# Patient Record
Sex: Male | Born: 1945 | Race: White | Hispanic: No | Marital: Married | Smoking: Former smoker
Health system: Southern US, Community
[De-identification: ages and names within clinical notes are randomized; demographics above are authoritative.]

## PROBLEM LIST (undated history)

## (undated) DIAGNOSIS — I499 Cardiac arrhythmia, unspecified: Secondary | ICD-10-CM

## (undated) DIAGNOSIS — R251 Tremor, unspecified: Secondary | ICD-10-CM

## (undated) DIAGNOSIS — Z87442 Personal history of urinary calculi: Secondary | ICD-10-CM

## (undated) DIAGNOSIS — T4145XA Adverse effect of unspecified anesthetic, initial encounter: Secondary | ICD-10-CM

## (undated) DIAGNOSIS — R609 Edema, unspecified: Secondary | ICD-10-CM

## (undated) DIAGNOSIS — F32A Depression, unspecified: Secondary | ICD-10-CM

## (undated) DIAGNOSIS — C801 Malignant (primary) neoplasm, unspecified: Secondary | ICD-10-CM

## (undated) DIAGNOSIS — R062 Wheezing: Secondary | ICD-10-CM

## (undated) DIAGNOSIS — T8859XA Other complications of anesthesia, initial encounter: Secondary | ICD-10-CM

## (undated) DIAGNOSIS — F329 Major depressive disorder, single episode, unspecified: Secondary | ICD-10-CM

## (undated) DIAGNOSIS — G939 Disorder of brain, unspecified: Secondary | ICD-10-CM

## (undated) DIAGNOSIS — R011 Cardiac murmur, unspecified: Secondary | ICD-10-CM

## (undated) DIAGNOSIS — G629 Polyneuropathy, unspecified: Secondary | ICD-10-CM

## (undated) DIAGNOSIS — G4730 Sleep apnea, unspecified: Secondary | ICD-10-CM

## (undated) DIAGNOSIS — R413 Other amnesia: Secondary | ICD-10-CM

## (undated) DIAGNOSIS — R0600 Dyspnea, unspecified: Secondary | ICD-10-CM

## (undated) DIAGNOSIS — I10 Essential (primary) hypertension: Secondary | ICD-10-CM

## (undated) DIAGNOSIS — Z950 Presence of cardiac pacemaker: Secondary | ICD-10-CM

## (undated) HISTORY — PX: KIDNEY STONE SURGERY: SHX686

## (undated) HISTORY — PX: INSERT / REPLACE / REMOVE PACEMAKER: SUR710

---

## 2004-07-09 ENCOUNTER — Ambulatory Visit: Payer: Self-pay | Admitting: Unknown Physician Specialty

## 2005-09-12 DIAGNOSIS — Z950 Presence of cardiac pacemaker: Secondary | ICD-10-CM

## 2005-09-12 HISTORY — DX: Presence of cardiac pacemaker: Z95.0

## 2005-09-12 HISTORY — PX: CARDIAC VALVE REPLACEMENT: SHX585

## 2006-07-12 ENCOUNTER — Ambulatory Visit: Payer: Self-pay | Admitting: Ophthalmology

## 2006-07-12 ENCOUNTER — Other Ambulatory Visit: Payer: Self-pay

## 2008-08-15 ENCOUNTER — Inpatient Hospital Stay: Payer: Self-pay | Admitting: Specialist

## 2008-09-11 ENCOUNTER — Emergency Department: Payer: Self-pay | Admitting: Emergency Medicine

## 2012-10-09 ENCOUNTER — Observation Stay: Payer: Self-pay | Admitting: Internal Medicine

## 2012-10-09 DIAGNOSIS — I517 Cardiomegaly: Secondary | ICD-10-CM

## 2012-10-09 LAB — COMPREHENSIVE METABOLIC PANEL
Albumin: 3.5 g/dL (ref 3.4–5.0)
Alkaline Phosphatase: 62 U/L (ref 50–136)
BUN: 12 mg/dL (ref 7–18)
Bilirubin,Total: 1 mg/dL (ref 0.2–1.0)
Calcium, Total: 8.4 mg/dL — ABNORMAL LOW (ref 8.5–10.1)
Co2: 25 mmol/L (ref 21–32)
Creatinine: 0.91 mg/dL (ref 0.60–1.30)
EGFR (African American): >60
EGFR (Non-African Amer.): >60
Osmolality: 279 (ref 275–301)
Potassium: 3.9 mmol/L (ref 3.5–5.1)
SGOT(AST): 35 U/L (ref 15–37)

## 2012-10-09 LAB — CK TOTAL AND CKMB (NOT AT ARMC)
CK, Total: 258 U/L — ABNORMAL HIGH (ref 35–232)
CK, Total: 254 U/L — ABNORMAL HIGH (ref 35–232)
CK-MB: 4.3 ng/mL — ABNORMAL HIGH (ref 0.5–3.6)
CK-MB: 3.6 ng/mL (ref 0.5–3.6)

## 2012-10-09 LAB — CBC
HCT: 40.7 % (ref 40.0–52.0)
MCH: 26.5 pg (ref 26.0–34.0)
MCHC: 32.8 g/dL (ref 32.0–36.0)
MCV: 81 fL (ref 80–100)
Platelet: 224 10*3/uL (ref 150–440)
RDW: 15.8 % — ABNORMAL HIGH (ref 11.5–14.5)

## 2012-10-09 LAB — MAGNESIUM: Magnesium: 2.1 mg/dL

## 2012-10-09 LAB — TROPONIN I
Troponin-I: <0.02 ng/mL
Troponin-I: 0.02 ng/mL

## 2012-10-09 LAB — PROTIME-INR: INR: 1.2

## 2012-10-09 LAB — TSH: Thyroid Stimulating Horm: 3.41 u[IU]/mL

## 2012-10-10 DIAGNOSIS — R079 Chest pain, unspecified: Secondary | ICD-10-CM

## 2012-10-10 LAB — HEMOGLOBIN A1C: Hemoglobin A1C: 6.1 % (ref 4.2–6.3)

## 2012-10-10 LAB — COMPREHENSIVE METABOLIC PANEL
Albumin: 3.3 g/dL — ABNORMAL LOW (ref 3.4–5.0)
Alkaline Phosphatase: 58 U/L (ref 50–136)
Chloride: 106 mmol/L (ref 98–107)
Creatinine: 0.83 mg/dL (ref 0.60–1.30)
EGFR (Non-African Amer.): >60
Osmolality: 278 (ref 275–301)
Potassium: 3.8 mmol/L (ref 3.5–5.1)
SGPT (ALT): 28 U/L (ref 12–78)

## 2012-10-10 LAB — CBC WITH DIFFERENTIAL/PLATELET
Basophil #: 0 10*3/uL (ref 0.0–0.1)
Basophil %: 0.3 %
Eosinophil #: 0.1 10*3/uL (ref 0.0–0.7)
Eosinophil %: 1.3 %
HGB: 13.4 g/dL (ref 13.0–18.0)
Lymphocyte %: 21.5 %
MCH: 26.8 pg (ref 26.0–34.0)
Monocyte #: 0.8 x10 3/mm (ref 0.2–1.0)
Neutrophil #: 5.3 10*3/uL (ref 1.4–6.5)
Platelet: 214 10*3/uL (ref 150–440)
RDW: 15.4 % — ABNORMAL HIGH (ref 11.5–14.5)

## 2012-10-10 LAB — LIPID PANEL
Cholesterol: 123 mg/dL (ref 0–200)
HDL Cholesterol: 26 mg/dL — ABNORMAL LOW (ref 40–60)
Ldl Cholesterol, Calc: 68 mg/dL (ref 0–100)
Triglycerides: 145 mg/dL (ref 0–200)
VLDL Cholesterol, Calc: 29 mg/dL (ref 5–40)

## 2012-10-10 LAB — CK TOTAL AND CKMB (NOT AT ARMC)
CK, Total: 328 U/L — ABNORMAL HIGH (ref 35–232)
CK-MB: 4.6 ng/mL — ABNORMAL HIGH (ref 0.5–3.6)

## 2012-11-19 ENCOUNTER — Emergency Department: Payer: Self-pay | Admitting: Emergency Medicine

## 2013-01-04 ENCOUNTER — Ambulatory Visit: Payer: Self-pay | Admitting: Neurology

## 2014-02-21 ENCOUNTER — Ambulatory Visit (INDEPENDENT_AMBULATORY_CARE_PROVIDER_SITE_OTHER): Payer: Medicare Other

## 2014-02-21 ENCOUNTER — Ambulatory Visit (INDEPENDENT_AMBULATORY_CARE_PROVIDER_SITE_OTHER): Payer: Medicare Other | Admitting: Podiatry

## 2014-02-21 ENCOUNTER — Encounter: Payer: Self-pay | Admitting: Podiatry

## 2014-02-21 VITALS — BP 127/74 | HR 65 | Resp 16 | Ht 71.0 in | Wt 240.0 lb

## 2014-02-21 DIAGNOSIS — G589 Mononeuropathy, unspecified: Secondary | ICD-10-CM

## 2014-02-21 DIAGNOSIS — M779 Enthesopathy, unspecified: Secondary | ICD-10-CM

## 2014-02-21 DIAGNOSIS — M2010 Hallux valgus (acquired), unspecified foot: Secondary | ICD-10-CM

## 2014-02-21 NOTE — Progress Notes (Signed)
   Subjective:    Patient ID: Juan Daniels, male    DOB: 13-Jan-1946, 68 y.o.   MRN: [id number]  HPI Comments: Its both of my feet and toes. i rub them together and it feels like sand paper. My feet swell and my feet turn purple. They are numb and the left is worse than the right. i have a callus on my left foot. This has been going on for about 2 yrs. My feet have remained the same. i soak my feet and use a callus pad.  Foot Pain Associated symptoms include coughing and numbness.      Review of Systems  Constitutional: Positive for appetite change and unexpected weight change.       Sweating  HENT: Positive for sneezing.   Eyes: Positive for redness and visual disturbance.  Respiratory: Positive for cough.   Cardiovascular: Positive for leg swelling.  Gastrointestinal: Positive for diarrhea and constipation.  Endocrine: Positive for heat intolerance.  Neurological: Positive for numbness.  All other systems reviewed and are negative.      Objective:   Physical Exam        Assessment & Plan:

## 2014-02-21 NOTE — Progress Notes (Signed)
Subjective:     Patient ID: Juan Daniels, male   DOB: 1945/10/23, 68 y.o.   MRN: [id number]  Foot Pain   patient presents stating he gets pain in both of his feet left over right and it feels like sandpaper and making burn and tingle at different times. Also has bunions which can become tender  Review of Systems  All other systems reviewed and are negative.      Objective:   Physical Exam  Nursing note and vitals reviewed. Constitutional: He is oriented to person, place, and time.  Cardiovascular: Intact distal pulses.   Musculoskeletal: Normal range of motion.  Neurological: He is oriented to person, place, and time.  Skin: Skin is warm.   I noted neurological sensation to be diminished left over right with reduced both sharp dull and vibratory. Patient is also noted to have digital deformities with lifting of the lesser digits and hyperostosis around the first metatarsal head left over right. Digital flow to the toes was found to be good and there is varicosities in the ankle region of both feet     Assessment:     Patient appears to have some kind of neuropathic changes with no diabetes present at this time. Also has structural imbalance    Plan:     I reviewed conditions and x-rays with patient. He is on gabapentin and will increase his daily dose of this by 300 mg and we discussed the possibility for neuro Genex treatment. Patient will be informed when it is available in the next several months

## 2014-04-01 ENCOUNTER — Ambulatory Visit: Payer: Medicare Other | Admitting: Podiatry

## 2015-01-02 NOTE — H&P (Signed)
PATIENT NAME:  Juan Daniels, Juan Daniels MR#:  [id number] DATE OF BIRTH:  Nov 10, 1945  DATE OF ADMISSION:  10/09/2012  PRIMARY CARE PHYSICIAN: [facility]  HISTORY OF PRESENT ILLNESS: The patient is a 69 year old Caucasian male with past medical history significant for history of December 2009 admission for atrial fibrillation and recently he has been on Pradaxa, hypertension, hyperlipidemia and history of prediabetes who presents to the hospital with complaints of chest pain. According to the patient, he has been having chest pain for the past 2 or 3 days now. Pain is midsternal and intermittent, however, it is uncomfortable, very achy and increasing whenever he walks around with no radiation. There is no significant shortness of breath or any other symptoms. He has been also noticing lower extremity swelling over the past 6 to 8 months now as well as some paresthesias which he describes as numbness sensation as well as sandpaper sensation in his lower extremities. Because of this discomfort, he decided to come to the Emergency Room. He has been also having significant lower extremity cramping. In the Emergency Room, he was noted to be in afib; however, his rate is controlled, in 80s to 90s. He was also hypertensive and he was emergently given metoprolol as well as lisinopril and hospitalist services were contacted for admission. His cardiac enzymes, first set, is unremarkable.   PAST MEDICAL HISTORY:  1. History of admission in December 2009. At that time, he was diagnosed with new onset atrial fibrillation. He is on Pradaxa now for atrial fibrillation. He was also noted to have cardiomyopathy with ejection fraction of 35% to 45%.  2. History of hypertension. 3. History of hyperlipidemia. 4. History of aortic valve disease status post aortic valve replacement in 2004. 5. History of permanent pacemaker placement for unknown reason, in 2010. 6. History of EGD done in October 2005 as well as colonoscopy  done in 2005. 7. History of scleral buckle of left eye in October 2007. 8. Porcine valve in 2004. 9. Prediabetes. 10. Nephrolithiasis. 11. Esophageal nodule. 12. Colon polyps. 13. 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 14. Hiatal hernia. 15. Aortic valvular disease, as mentioned above.   ALLERGIES: No known drug allergies.   MEDICATIONS:  1. Aspirin 81 mg p.o. daily.  2. Cyanocobalamin 1000 mcg p.o. daily.  3. Digoxin 250 mcg p.o. daily.  4. Lisinopril 5 mg p.o. daily. 5. Loratadine 10 mg p.o. daily.  6. Metoprolol succinate 200 mg p.o. daily.  7. Pradaxa 150 mg p.o. twice daily. 8. Sertraline 100 mg 2 tablets once at bedtime.  FAMILY HISTORY: The patient's mother is deceased. She had alcoholism as well as liver disease. The patient's father is deceased from myocardial infarction at age of 3.   SOCIAL HISTORY: The patient has no tobacco, alcohol or illicit drug abuse. He lives in [place], [place]. He is retired. He lives with his wife and he has three children.  REVIEW OF SYSTEMS: Positive for chest pains which started approximately 2 to 3 days ago. He feels tired. He has some blurry vision for which he uses reading glasses. He has a runny nose intermittently. Also obstructive sleep apnea syndrome, especially in the past, as well as stopping to breathe at nighttime while sleeping, also sleepiness in daytime as well as headaches intermittently. Admits to having some cough intermittently with whitish phlegm production and sick for the past couple of days. Admits of having some chest pains, in mid sternal area of the chest. Admits of having some sandpaper feeling  in his feet and numbness of his feet from his knees down as well as some tingling sensation, very uncomfortable feeling. He denies any significant weakness. Admits of having significant cramping in his lower extremities and he needs to jump out from bed.  CONSTITUTIONAL: On arrival the hospital, otherwise, he  denies any fevers, chills, fatigue, weakness, weight loss or gain.  EYES: Denies blurry vision, double vision, glaucoma or cataracts.  ENT: Denies any tinnitus, allergies, epistaxis, sinus pain, dentures or trouble swallowing.  PULMONARY: Denies any wheezing, asthma or COPD. CARDIOVASCULAR: Denies any orthopnea, edema, arrhythmias, palpitations or syncope. GASTROINTESTINAL: Denies any nausea, vomiting, diarrhea or constipation. GENITOURINARY: Denies any dysuria, frequency or incontinence.  ENDOCRINE: Denies any polydipsia,  nocturia, thyroid problems, heat or cold intolerance or thirst. HEMATOLOGIC: Denies anemia, easy bruising, bleeding or swollen glands.  SKIN: Denies any acne, rashes, lesions or change in moles.  MUSCULOSKELETAL: Denies arthritis, cramps or swelling.  NEUROLOGIC: Denies numbness, epilepsy or tremor.  PSYCHIATRIC: Denies anxiety, insomnia or depression.   PHYSICAL EXAMINATION: VITAL SIGNS: On arrival to the hospital, the patient temperature was 98, pulse was 91, respiratory rate was 20, blood pressure 141/92 and saturation was 98% on room air.  GENERAL: Well-nourished, obese Caucasian male in no significant distress lying on the stretcher. He is somewhat uncomfortable, moving his lower extremities periodically. He has also intermittent cramping in his lower extremities.  HEENT: His pupils are equal and reactive to light. Extraocular movements intact. No icterus or conjunctivitis. Normal hearing. No pharyngeal erythema. Mucosa is moist.  NECK: No masses. Supple and nontender. Thyroid not enlarged. No adenopathy. No JVD or carotid bruits bilaterally. Full range of motion.  PULMONARY: Lungs are clear to auscultation, few crackles were heard at bases. Otherwise, no wheezing, no labored inspiration, increased effort, tenderness to percussion or overt respiratory distress.  CARDIOVASCULAR: S1 and S2 appreciated. Irregular, regular. No murmurs, gallops or rubs noted. PMI not  lateralized. Chest is nontender to palpation. 1+ pedal pulses. Trace lower extremity edema. No calf tenderness or cyanosis was noted.  ABDOMEN: Soft and nontender. Bowel sounds are present. No hepatosplenomegaly or masses were noted.  RECTAL: Deferred.  MUSCLE STRENGTH: Able to move all extremities. No cyanosis, degenerative joint disease or kyphosis. Gait was not tested.  SKIN: No rashes, lesions, erythema, nodularity or induration. It was warm and dry to palpation.  LYMPH: No adenopathy in the cervical region.  NEUROLOGICAL: Cranial nerves grossly intact. Sensory is diminished in lower extremities, below his knees, to light touch. No Babinski. No dysarthria or aphasia. The patient is alert and oriented to time, person and place. Cooperative. Memory is good. No significant confusion, agitation or depression noted.   LABS/RADIOLOGIC STUDIES: BMP within normal limits, except calcium level was low at 8.4. Liver enzymes were unremarkable. CK total was elevated at 258, MB fraction is 4.5, and troponin is less than 0.02. Digoxin level is 2.39. White blood cell count is normal at 6.6, hemoglobin was 13.4 and platelet count 224. Pro time is 15.2. INR is 1.2. Activated PTT was 37.4.   Chest x-ray, portable single view, on 28th of January 2014, showed left-sided pacemaker, sternotomy wires are present. Otherwise lungs are clear. No changes were noted.   The patient's EKG revealed afib with premature aberrant conducted complexes at rate of 96 beats per minutes, normal axis. ST-T abnormalities in inferior leads as well as anterolateral leads. No EKG to compare with.   ASSESSMENT AND PLAN: 1. Unstable angina. Admit the patient to the medical floor.  Continue him on metoprolol and aspirin. Add nitroglycerin as well as Lovenox sub-Q, full dose. We will check cardiac enzymes x 3. We will get cardiologist involved for recommendations. We will get Myoview stress test in the morning.  2. Hypertension. Continue  outpatient medications. Add also nitroglycerin topically.  3. Hyperlipidemia. Start the patient on Lipitor and check lipid panel in the morning.  4. History of cardiomyopathy. Get echocardiogram to see valve function and the patient would benefit from ACE inhibitor, which we are going to continue at the current doses at this time.  5. Paraesthesias as well as cramping in lower extremities of unclear etiology. Questionable diabetic neuropathy versus some other disease. We will get neurology involved and we will check the patient's TSH as well as B12 level.   TIME SPENT: 50 minutes. ____________________________ Theodoro Grist, MD rv:sb D: 10/09/2012 12:04:32 ET T: 10/09/2012 12:28:23 ET JOB#: [id number]  cc: Theodoro Grist, MD, <Dictator> [facility] [place]upp MD ELECTRONICALLY SIGNED 11/01/2012 18:27

## 2015-01-02 NOTE — Consult Note (Signed)
General Aspect 69 year old Caucasian male with PMH  of atrial fibrillation since 2009  on Pradaxa, AVR with bioprosthetic valve, moderate aortic root dilation,  hypertension, hyperlipidemia, prediabetes who presents to the hospital with complaints of chest pain. Cardiology was consulted for sx of chest ain concerning for angina.  According to the patient, he has been having chest pain for the past 2 or 3 days. Pain is midsternal and intermittent, very uncomfortable, achy and increasing whenever he walks around with no radiation. no shortness of breath.  also noticed lower extremity swelling over the past few months now as well as some paresthesias which he describes as numbness sensation.  also having significant lower extremity cramping.  "blood pressure has been up"  In the Emergency Room, noted to be in afib; his rate is in 80s to 90s.  hypertensive and he was given metoprolol and lisinopril .    Present Illness . FAMILY HISTORY: mother is deceased. She had alcoholism as well as liver disease. The patient's father is deceased from myocardial infarction at age of 51.   SOCIAL HISTORY: The patient has no tobacco, alcohol or illicit drug abuse. He lives in [place]. retired, lives with his wife and he has three children.   Physical Exam:   GEN well developed, well nourished, no acute distress    HEENT red conjunctivae    NECK supple  No masses    RESP normal resp effort  clear BS    CARD Irregular rate and rhythm  No murmur    ABD denies tenderness  soft    LYMPH negative neck    EXTR negative edema    SKIN normal to palpation    NEURO motor/sensory function intact    PSYCH alert, A+O to time, place, person, good insight   Review of Systems:   Subjective/Chief Complaint legs feel funny, swollen, sandpaper-like    General: No Complaints    Skin: No Complaints    ENT: No Complaints    Eyes: No Complaints    Neck: No Complaints    Respiratory: No Complaints     Cardiovascular: Chest pain or discomfort    Gastrointestinal: No Complaints    Genitourinary: No Complaints    Vascular: No Complaints    Musculoskeletal: No Complaints    Neurologic: leg numbness up to knees    Hematologic: No Complaints    Endocrine: No Complaints    Psychiatric: No Complaints    Review of Systems: All other systems were reviewed and found to be negative    Medications/Allergies Reviewed Medications/Allergies reviewed     Borderline Diabetes:    CAD:    HTN:    CABG:        Admit Reason:   Chest pain: (786.50) Active, ICD9, Unspecified chest pain  Home Medications: Medication Instructions Status  cyanocobalamin 1000 mcg oral tablet 1 tab(s) orally once a day Active  lisinopril tablet 10 mg 0.5 tab(s) orally once a day Active  aspirin 81 mg oral tablet 1 tab(s) orally once a day Active  metoprolol succinate 200 mg oral tablet, extended release 1 tab(s) orally once a day Active  Pradaxa 150 mg oral capsule 1 cap(s) orally 2 times a day Active  loratadine 10 mg oral tablet 1 tab(s) orally once a day Active  digoxin 250 mcg (0.25 mg) oral tablet 1 tab(s) orally once a day Active  sertraline 100 mg oral tablet 2 tab(s) orally once a day (at bedtime) Active  simvastatin 80 mg oral tablet  0.5 tab(s) orally once a day (at bedtime) Active   Lab Results:  Hepatic:  29-Jan-14 01:10    Bilirubin, Total  1.2   Alkaline Phosphatase 58   SGPT (ALT) 28   SGOT (AST) 30   Total Protein, Serum 6.6   Albumin, Serum  3.3  Routine Chem:  29-Jan-14 01:10    Cholesterol, Serum 123   Triglycerides, Serum 145   HDL (INHOUSE)  26   VLDL Cholesterol Calculated 29   LDL Cholesterol Calculated 68 (Result(s) reported on 10 Oct 2012 at 01:50AM.)   Glucose, Serum 97   BUN 10   Creatinine (comp) 0.83   Sodium, Serum 140   Potassium, Serum 3.8   Chloride, Serum 106   CO2, Serum 24   Calcium (Total), Serum 8.6   Osmolality (calc) 278   eGFR (African American)  >60   eGFR (Non-African American) >60 (eGFR values <78m/min/1.73 m2 may be an indication of chronic kidney disease (CKD). Calculated eGFR is useful in patients with stable renal function. The eGFR calculation will not be reliable in acutely ill patients when serum creatinine is changing rapidly. It is not useful in  patients on dialysis. The eGFR calculation may not be applicable to patients at the low and high extremes of body sizes, pregnant women, and vegetarians.)   Anion Gap 10  Cardiac:  29-Jan-14 01:10    CK, Total  328   CPK-MB, Serum  4.6 (Result(s) reported on 10 Oct 2012 at 01:40AM.)   Troponin I < 0.02 (0.00-0.05 0.05 ng/mL or less: NEGATIVE  Repeat testing in 3-6 hrs  if clinically indicated. >0.05 ng/mL: POTENTIAL  MYOCARDIAL INJURY. Repeat  testing in 3-6 hrs if  clinically indicated. NOTE: An increase or decrease  of 30% or more on serial  testing suggests a  clinically important change)  Routine Hem:  29-Jan-14 01:10    WBC (CBC) 8.0   RBC (CBC) 5.02   Hemoglobin (CBC) 13.4   Hematocrit (CBC) 40.5   Platelet Count (CBC) 214   MCV 81   MCH 26.8   MCHC 33.2   RDW  15.4   Neutrophil % 66.6   Lymphocyte % 21.5   Monocyte % 10.3   Eosinophil % 1.3   Basophil % 0.3   Neutrophil # 5.3   Lymphocyte # 1.7   Monocyte # 0.8   Eosinophil # 0.1   Basophil # 0.0 (Result(s) reported on 10 Oct 2012 at 01:53AM.)   EKG:   Interpretation EKG shows NSR with diffuse St adn T wave ABN in V4 to V6, I and AVL, II, III, AVF    NKDA: None  Vital Signs/Nurse's Notes: **Vital Signs.:   29-Jan-14 08:00   Vital Signs Type Routine   Temperature Temperature (F) 98.1   Celsius 36.7   Temperature Source oral   Pulse Pulse 78   Respirations Respirations 18   Systolic BP Systolic BP 1301  Diastolic BP (mmHg) Diastolic BP (mmHg) 83   Mean BP 96   Pulse Ox % Pulse Ox % 95   Pulse Ox Activity Level  At rest   Oxygen Delivery Room Air/ 21 %     Impression 69year old  Caucasian male with PMH  of atrial fibrillation since 2009  on Pradaxa, AVR with bioprosthetic valve, moderate aortic root dilation,  hypertension, hyperlipidemia, prediabetes who presents to the hospital with complaints of chest pain, leg swelling, numbness, "high blood pressure".  1)Chest pain Some typical and atypical features. Significant EKG changes, diffuse  ST and T wave changes (previously noted in cardiology consult in 12/09) ECHO with normal EF. Stress test pending If no ischemia, could d/c from cardiac perspective Will need outpt followup He is on asa, statin, b-blocker  2) Atrial fibrillation Chronic since 2009 Rate well controlled. On pradaxa (though he does not like this medication) He could discuss alternatives with MD at the [facility] (xarelto?)  3) AVR (for regurg?) bioprothetic valve working well. well seated, no significant regurg  4) Dilated aortic root  moderate size He will need close followup in our office or the [place] Will need outpt chest CT with contrast for aortic root dilitation, to evaluate the ascending aorta/arch. 4.7 cm root, normal <3 cm  5) Neuropathy of LE This seems to bother him the most, getting worse, now up to knees. Take B12 po at home Could check b12 level. Other etiology?   Electronic Signatures: Ida Rogue (MD)  (Signed 29-Jan-14 10:54)  Authored: General Aspect/Present Illness, History and Physical Exam, Review of System, Past Medical History, Health Issues, Home Medications, Labs, EKG , Allergies, Vital Signs/Nurse's Notes, Impression/Plan   Last Updated: 29-Jan-14 10:54 by Ida Rogue (MD)

## 2015-01-02 NOTE — Discharge Summary (Signed)
PATIENT NAME:  Juan Daniels, Juan Daniels MR#:  [id number] DATE OF BIRTH:  16-Jan-1946  DATE OF ADMISSION:  10/09/2012 DATE OF DISCHARGE:  10/10/2012  ADMITTING DIAGNOSIS: Unstable angina.   DISCHARGE DIAGNOSES: 1. Chest pain of unclear etiology at this time, likely noncardiac. Normal Myoview. Echocardiogram showed normal ejection fraction of more than 55% with moderate concentric left ventricular hypertrophy, aortic root dilatation. Right ventricular systolic pressures are normal. 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 2. History of hypertension. 3. Hyperlipidemia. 4. Paresthesias. 5. 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 Outpatient neurology followup is pending.  6. History of atrial fibrillation. 7. Prediabetes with hemoglobin A1c 6.1 on current admission.  DISCHARGE CONDITION: Stable.   DISCHARGE MEDICATIONS: The patient is to resume his outpatient medications which are:  1. Lisinopril 5 mg p.o. daily.  2. Digoxin 250 mcg p.o. daily. 3. Pradaxa 150 mg p.o. twice daily. 4. Sertraline 200 mg p.o. at bedtime.  5. Loratadine 10 mg p.o. at bedtime.  6. Simvastatin 40 mg p.o. at bedtime.  7. Aspirin 81 mg p.o. daily.  8. Metoprolol succinate 200 mg p.o. day. 9. vitamin B12 1000 mcg p.o. daily.  HOME OXYGEN: None.   DIET: 2 grams salt, low fat, low cholesterol, carbohydrate-controlled diet, regular consistency.   ACTIVITY LIMITATIONS: As tolerated.   DISCHARGE FOLLOWUP: With the [facility] in 2 days after discharge.   Recommend CT scanning of the patient's chest as soon as possible to compare with prior evaluations, in regards to aortic root dilatation.  CONSULTANTS: Dr. Ida Rogue and care management.   RADIOLOGIC STUDIES: Chest x-ray, portable single view, on 28th of January 20014, revealed left-sided pacemaker present, sternotomy wires are present, otherwise everything  unremarkable.  Nuclear medicine myocardial scan, on 29th of January 20014, showed pharmacological myocardial perfusion imaging study no significant ischemia was noted. No wall motion abnormality noted. Ejection fraction is 70%. No EKG changes concerning for ischemia. Overall this is a low risk scan. Chest pain is likely noncardiac, according to cardiologist, Dr. Rockey Situ.  Echocardiogram, on 28th of January 20014, challenging image study: Moderate aortic root dilatation. Left ventricular systolic function is normal, ejection fraction equal or more than 55%. There is moderate concentric left ventricular hypertrophy. The left atrium is slightly dilated. Right ventricular systolic pressure is normal. Bioprosthetic valve: Mild gradient consistent with bioprosthetic valve was noted.  HISTORY OF PRESENT ILLNESS: This is a  69 year old Caucasian male with history of coronary artery disease who presented to the hospital on the 28th of January 20014 with complaints of chest pains. Please refer to Dr. Keenan Bachelor admission note on the 28th of January 20014. On arrival to the hospital, the patient was complaining of midsternal, intermittent pain, achy, and increasing whenever he walked around. His vital signs on arrival to the hospital showed temperature of 98, pulse was 91, respiratory rate was 20, blood pressure 141/92 and saturation was 98% on room air. Of note, the patient was missing all his medications in the past 3 days because of some issues receiving medications. The patient's EKG showed afib with premature aberrantly conduced complexes at rate of 96 beats per minute, normal axis, ST-T wave abnormalities in inferior leads as well as anterolateral leads, but no EKG to compare with. ST-T depressions were noted in anterolateral as well as inferior leads, but no EKG to compare with. Chest x-ray was unremarkable.  HOSPITAL COURSE: The patient was admitted to the hospital. His first labs in the Emergency  Room where  unremarkable with normal BMP, normal magnesium level of 2.1, normal liver enzyme testing and normal cardiac enzymes, except for mild elevation of CK total to 258 and MB fraction of 4.3. TSH was normal at 3.41. Digoxin level was 2.39. CBC was within normal limits with hemoglobin level of 13.4, white blood cell count 6.6 and platelet count of 224. Coagulation panel showed a pro time of 15.2, INR was 1.2 and activated PTT was 37.4. D-dimer was normal at 0.34. Vitamin B12 in serum level was 950.  The patient was admitted to the hospital. His cardiac enzymes were cycled and consultation with Dr. Rockey Situ was obtained. Echocardiogram as well as Myoview stress test were performed and those were unremarkable. The patient's echocardiogram revealed LVH as well as aortic root dilatation, however,  Dr. Rockey Situ felt that the patient would benefit from follow up with the [place] or Dr. Donivan Scull office, as outpatient, to get a CT scan of the chest given the moderate size dilation of the aortic root on echocardiogram. The patient needs to be back to have his distended aorta evaluated. However, he felt that this could have been related to his valve. Dr. Rockey Situ also felt that the patient's chest pain etiology was likely noncardiac. The patient was reinitiated on his usual medications and his chest pain disappeared. His chest pain cause remains unclear, however, it did not seem to be cardiac. It was felt the patient is safe to be discharged to home to follow up with his primary care physician at the [facility] and that was communicated to the patient on discharge.  In regards to hypertension and hyperlipidemia, the patient is to continue his outpatient medications as mentioned. Blood pressure was well controlled. On the day of discharge, 29th of January 2014, the patient's temperature is 97.4, pulse was 59, respiratory rate was 18, blood pressure 107/66 and saturation 94% to 95% on room air at rest.   For hyperlipidemia, the patient's  lipid panel was checked while he was in the hospital and LDL was found to be 68. The patient's total cholesterol was 123, triglycerides 145 and HDL was 26. Hemoglobin A1c was checked and was found to be 6.1. The patient is to continue his lipid management with his current medications.   The patient was complaining of significant paraesthesias in his lower extremities, seems to be worsening over a period of a few months now. It was unclear if the patient has neuropathy of unclear etiology. The patient's B12 level was checked which was good and the patient will be scheduled for followup with a neurologist as outpatient.   In regards to his history of afib, the patient is to continue metoprolol.  For prediabetes, the patient is to continue diabetic diet and if possible lose weight and that was also communicated to him.   The patient is being discharged in stable condition with the above-mentioned medications and followup.   TIME SPENT: 40 minutes.  ____________________________ Theodoro Grist, MD rv:sb D: 10/10/2012 20:06:17 ET T: 10/11/2012 07:16:20 ET JOB#: [id number]  cc: Theodoro Grist, MD, <Dictator> [place] MD ELECTRONICALLY SIGNED 11/01/2012 18:39

## 2016-07-19 ENCOUNTER — Encounter: Payer: Self-pay | Admitting: *Deleted

## 2016-07-21 ENCOUNTER — Ambulatory Visit: Payer: Medicare Other | Admitting: Anesthesiology

## 2016-07-21 ENCOUNTER — Encounter
Admission: RE | Disposition: A | Discharge status: Home / Self Care | Payer: Self-pay | Source: Ambulatory Visit | Attending: Ophthalmology

## 2016-07-21 ENCOUNTER — Ambulatory Visit
Admission: RE | Admit: 2016-07-21 | Discharge: 2016-07-21 | Disposition: A | Discharge status: Home / Self Care | Payer: Medicare Other | Source: Ambulatory Visit | Attending: Ophthalmology | Admitting: Ophthalmology

## 2016-07-21 ENCOUNTER — Encounter: Payer: Self-pay | Admitting: Anesthesiology

## 2016-07-21 DIAGNOSIS — Z952 Presence of prosthetic heart valve: Secondary | ICD-10-CM | POA: Insufficient documentation

## 2016-07-21 DIAGNOSIS — I10 Essential (primary) hypertension: Secondary | ICD-10-CM | POA: Diagnosis not present

## 2016-07-21 DIAGNOSIS — G629 Polyneuropathy, unspecified: Secondary | ICD-10-CM | POA: Diagnosis not present

## 2016-07-21 DIAGNOSIS — Z87891 Personal history of nicotine dependence: Secondary | ICD-10-CM | POA: Insufficient documentation

## 2016-07-21 DIAGNOSIS — I4891 Unspecified atrial fibrillation: Secondary | ICD-10-CM | POA: Diagnosis not present

## 2016-07-21 DIAGNOSIS — Z7982 Long term (current) use of aspirin: Secondary | ICD-10-CM | POA: Insufficient documentation

## 2016-07-21 DIAGNOSIS — Z950 Presence of cardiac pacemaker: Secondary | ICD-10-CM | POA: Diagnosis not present

## 2016-07-21 DIAGNOSIS — G4730 Sleep apnea, unspecified: Secondary | ICD-10-CM | POA: Insufficient documentation

## 2016-07-21 DIAGNOSIS — Z85828 Personal history of other malignant neoplasm of skin: Secondary | ICD-10-CM | POA: Diagnosis not present

## 2016-07-21 DIAGNOSIS — H2512 Age-related nuclear cataract, left eye: Secondary | ICD-10-CM | POA: Diagnosis present

## 2016-07-21 DIAGNOSIS — Z79899 Other long term (current) drug therapy: Secondary | ICD-10-CM | POA: Diagnosis not present

## 2016-07-21 HISTORY — DX: Cardiac arrhythmia, unspecified: I49.9

## 2016-07-21 HISTORY — DX: Other amnesia: R41.3

## 2016-07-21 HISTORY — DX: Edema, unspecified: R60.9

## 2016-07-21 HISTORY — DX: Sleep apnea, unspecified: G47.30

## 2016-07-21 HISTORY — DX: Tremor, unspecified: R25.1

## 2016-07-21 HISTORY — DX: Other complications of anesthesia, initial encounter: T88.59XA

## 2016-07-21 HISTORY — DX: Personal history of urinary calculi: Z87.442

## 2016-07-21 HISTORY — DX: Major depressive disorder, single episode, unspecified: F32.9

## 2016-07-21 HISTORY — DX: Cardiac murmur, unspecified: R01.1

## 2016-07-21 HISTORY — DX: Disorder of brain, unspecified: G93.9

## 2016-07-21 HISTORY — DX: Depression, unspecified: F32.A

## 2016-07-21 HISTORY — DX: Presence of cardiac pacemaker: Z95.0

## 2016-07-21 HISTORY — DX: Adverse effect of unspecified anesthetic, initial encounter: T41.45XA

## 2016-07-21 HISTORY — DX: Essential (primary) hypertension: I10

## 2016-07-21 HISTORY — DX: Dyspnea, unspecified: R06.00

## 2016-07-21 HISTORY — DX: Polyneuropathy, unspecified: G62.9

## 2016-07-21 HISTORY — PX: CATARACT EXTRACTION W/PHACO: SHX586

## 2016-07-21 HISTORY — DX: Wheezing: R06.2

## 2016-07-21 HISTORY — DX: Malignant (primary) neoplasm, unspecified: C80.1

## 2016-07-21 SURGERY — PHACOEMULSIFICATION, CATARACT, WITH IOL INSERTION
Anesthesia: Monitor Anesthesia Care | Site: Eye | Laterality: Left | Wound class: Clean

## 2016-07-21 MED ORDER — MOXIFLOXACIN HCL 0.5 % OP SOLN
1.0000 [drp] | OPHTHALMIC | Status: DC | PRN
  Administered 2016-07-21 (×3): 1 [drp] via OPHTHALMIC

## 2016-07-21 MED ORDER — SODIUM HYALURONATE 23 MG/ML IO SOLN
INTRAOCULAR | Status: AC
  Filled 2016-07-21: qty 0.6

## 2016-07-21 MED ORDER — SODIUM HYALURONATE 10 MG/ML IO SOLN
INTRAOCULAR | Status: DC | PRN
Start: 2016-07-21 — End: 2016-07-21
  Administered 2016-07-21: 0.85 mL via INTRAOCULAR

## 2016-07-21 MED ORDER — LIDOCAINE HCL (PF) 4 % IJ SOLN
INTRAMUSCULAR | Status: DC | PRN
  Administered 2016-07-21: 4 mL via OPHTHALMIC

## 2016-07-21 MED ORDER — EPINEPHRINE PF 1 MG/ML IJ SOLN
INTRAMUSCULAR | Status: DC | PRN
  Administered 2016-07-21: 1 mL via OPHTHALMIC

## 2016-07-21 MED ORDER — MIDAZOLAM HCL 2 MG/2ML IJ SOLN
INTRAMUSCULAR | Status: DC | PRN
  Administered 2016-07-21: 1 mg via INTRAVENOUS

## 2016-07-21 MED ORDER — CYCLOPENTOLATE HCL 2 % OP SOLN
1.0000 [drp] | OPHTHALMIC | Status: DC | PRN
  Administered 2016-07-21 (×3): 1 [drp] via OPHTHALMIC

## 2016-07-21 MED ORDER — LIDOCAINE HCL (PF) 4 % IJ SOLN
INTRAMUSCULAR | Status: AC
  Filled 2016-07-21: qty 5

## 2016-07-21 MED ORDER — MOXIFLOXACIN HCL 0.5 % OP SOLN
OPHTHALMIC | Status: DC | PRN
  Administered 2016-07-21: 1 [drp] via OPHTHALMIC

## 2016-07-21 MED ORDER — SODIUM HYALURONATE 10 MG/ML IO SOLN
INTRAOCULAR | Status: AC
  Filled 2016-07-21: qty 0.85

## 2016-07-21 MED ORDER — ARMC OPHTHALMIC DILATING DROPS: 1.0000 "application " | OPHTHALMIC | Status: AC

## 2016-07-21 MED ORDER — POVIDONE-IODINE 5 % OP SOLN
OPHTHALMIC | Status: AC
  Filled 2016-07-21: qty 30

## 2016-07-21 MED ORDER — EPINEPHRINE PF 1 MG/ML IJ SOLN
INTRAMUSCULAR | Status: AC
  Filled 2016-07-21: qty 2

## 2016-07-21 MED ORDER — CARBACHOL 0.01 % IO SOLN
INTRAOCULAR | Status: DC | PRN
  Administered 2016-07-21: 0.5 mL via INTRAOCULAR

## 2016-07-21 MED ORDER — PHENYLEPHRINE HCL 10 % OP SOLN
1.0000 [drp] | OPHTHALMIC | Status: DC
  Administered 2016-07-21 (×3): 1 [drp] via OPHTHALMIC

## 2016-07-21 MED ORDER — SODIUM CHLORIDE 0.9 % IV SOLN
INTRAVENOUS | Status: DC
  Administered 2016-07-21: 09:00:00 via INTRAVENOUS

## 2016-07-21 MED ORDER — SODIUM HYALURONATE 23 MG/ML IO SOLN
INTRAOCULAR | Status: DC | PRN
Start: 2016-07-21 — End: 2016-07-21
  Administered 2016-07-21: 0.6 mL via INTRAOCULAR

## 2016-07-21 SURGICAL SUPPLY — 23 items
CANNULA ANT/CHMB 27GA (MISCELLANEOUS) ×6 IMPLANT
CUP MEDICINE 2OZ PLAST GRAD ST (MISCELLANEOUS) ×3 IMPLANT
DISSECTOR HYDRO NUCLEUS 50X22 (MISCELLANEOUS) ×6 IMPLANT
GLOVE BIO SURGEON STRL SZ8 (GLOVE) ×3 IMPLANT
GLOVE BIOGEL M 6.5 STRL (GLOVE) ×3 IMPLANT
GLOVE SURG LX 7.5 STRW (GLOVE) ×2
GLOVE SURG LX STRL 7.5 STRW (GLOVE) ×1 IMPLANT
GOWN STRL REUS W/ TWL LRG LVL3 (GOWN DISPOSABLE) ×2 IMPLANT
GOWN STRL REUS W/TWL LRG LVL3 (GOWN DISPOSABLE) ×4
LENS IOL TECNIS ITEC 17.5 (Intraocular Lens) ×3 IMPLANT
NEEDLE CAPSULORHEX 25GA (NEEDLE) ×3 IMPLANT
PACK CATARACT (MISCELLANEOUS) ×3 IMPLANT
PACK CATARACT BRASINGTON LX (MISCELLANEOUS) ×3 IMPLANT
PACK EYE AFTER SURG (MISCELLANEOUS) ×3 IMPLANT
SOL BSS BAG (MISCELLANEOUS) ×3
SOL PREP PVP 2OZ (MISCELLANEOUS) ×3
SOLUTION BSS BAG (MISCELLANEOUS) ×1 IMPLANT
SOLUTION PREP PVP 2OZ (MISCELLANEOUS) ×1 IMPLANT
SYR 3ML LL SCALE MARK (SYRINGE) ×6 IMPLANT
SYR 5ML LL (SYRINGE) ×3 IMPLANT
SYR TB 1ML 27GX1/2 LL (SYRINGE) ×3 IMPLANT
WATER STERILE IRR 250ML POUR (IV SOLUTION) ×3 IMPLANT
WIPE NON LINTING 3.25X3.25 (MISCELLANEOUS) ×3 IMPLANT

## 2016-07-21 NOTE — Anesthesia Preprocedure Evaluation (Addendum)
Anesthesia Evaluation  Patient identified by MRN, date of birth, ID band Patient awake    Reviewed: Allergy & Precautions, NPO status , Patient's Chart, lab work & pertinent test results, reviewed documented beta blocker date and time   Airway Mallampati: II  TM Distance: >3 FB     Dental  (+) Upper Dentures, Lower Dentures   Pulmonary shortness of breath, sleep apnea , former smoker,           Cardiovascular hypertension, Pt. on medications + dysrhythmias Atrial Fibrillation + pacemaker + Valvular Problems/Murmurs      Neuro/Psych    GI/Hepatic   Endo/Other    Renal/GU      Musculoskeletal   Abdominal   Peds  Hematology   Anesthesia Other Findings Pt states aortic valve.  Reproductive/Obstetrics                            Anesthesia Physical Anesthesia Plan  ASA: III  Anesthesia Plan: MAC   Post-op Pain Management:    Induction:   Airway Management Planned:   Additional Equipment:   Intra-op Plan:   Post-operative Plan:   Informed Consent: I have reviewed the patients History and Physical, chart, labs and discussed the procedure including the risks, benefits and alternatives for the proposed anesthesia with the patient or authorized representative who has indicated his/her understanding and acceptance.     Plan Discussed with: CRNA  Anesthesia Plan Comments:         Anesthesia Quick Evaluation

## 2016-07-21 NOTE — Op Note (Signed)
OPERATIVE NOTE  Juan Daniels [id number] 07/21/2016   PREOPERATIVE DIAGNOSIS:  Nuclear sclerotic cataract left eye.  H25.12   POSTOPERATIVE DIAGNOSIS:    Nuclear sclerotic cataract left eye.     PROCEDURE:  Phacoemusification with posterior chamber intraocular lens placement of the left eye   LENS:   Implant Name Type Inv. Item Serial No. Manufacturer Lot No. LRB No. Used  LENS IOL DIOP 17.5 - [id number] Intraocular Lens LENS IOL DIOP 17.5 [id number]   Left 1       PCB00 +17.5   ULTRASOUND TIME: 0 minutes 51 seconds.  CDE 7.70   SURGEON:  Benay Pillow, MD, MPH   ANESTHESIA:  Topical with tetracaine drops augmented with 1% preservative-free intracameral lidocaine.   COMPLICATIONS:  None.   DESCRIPTION OF PROCEDURE:  The patient was identified in the holding room and transported to the operating room and placed in the supine position under the operating microscope.  The left eye was identified as the operative eye and it was prepped and draped in the usual sterile ophthalmic fashion.   A 1.0 millimeter clear-corneal paracentesis was made at the 5:00 position. 0.5 ml of preservative-free 1% lidocaine with epinephrine was injected into the anterior chamber.  The anterior chamber was filled with Healon 5 viscoelastic.  A 2.4 millimeter keratome was used to make a near-clear corneal incision at the 2:00 position.  A curvilinear capsulorrhexis was made with a cystotome and capsulorrhexis forceps.  Balanced salt solution was used to hydrodissect and hydrodelineate the nucleus.   Phacoemulsification was then used in stop and chop fashion to remove the lens nucleus and epinucleus.  The remaining cortex was then removed using the irrigation and aspiration handpiece. Healon was then placed into the capsular bag to distend it for lens placement.  A lens was then injected into the capsular bag.  The remaining viscoelastic was aspirated.   Wounds were hydrated with balanced salt solution.  The  anterior chamber was inflated to a physiologic pressure with balanced salt solution.  Intracameral vigamox 0.1 mL undiltued was injected into the eye.  There was moderate patient movement throughout the case as he drifted in and out of consciousness.  The pupil was moderately dilated.   No wound leaks were noted.  Topical Vigamox drops were applied to the eye.  The patient was taken to the recovery room in stable condition without complications of anesthesia or surgery  Benay Pillow 07/21/2016, 9:49 AM

## 2016-07-21 NOTE — Transfer of Care (Signed)
Immediate Anesthesia Transfer of Care Note  Patient: Juan Daniels  Procedure(s) Performed: Procedure(s) with comments: CATARACT EXTRACTION PHACO AND INTRAOCULAR LENS PLACEMENT (IOC) (Left) - [place] 51.1 AP% 15.1 CDE 7.70 FLUID PACK LOT # [id number]  Patient Location: PACU  Anesthesia Type:MAC  Level of Consciousness: awake, alert  and oriented  Airway & Oxygen Therapy: Patient Spontanous Breathing  Post-op Assessment: Report given to RN  Post vital signs: Reviewed and stable  Last Vitals:  Vitals:   07/21/16 0825 07/21/16 0950  BP: 139/81 127/77  Pulse: 63 61  Resp: 17 12  Temp: 36.8 C 36.8 C    Last Pain:  Vitals:   07/21/16 0950  TempSrc: Oral      Patients Stated Pain Goal: 0 ([id number])  Complications: No apparent anesthesia complications

## 2016-07-21 NOTE — Discharge Instructions (Signed)
Eye Surgery Discharge Instructions  Expect mild scratchy sensation or mild soreness. DO NOT RUB YOUR EYE!  The day of surgery:  Minimal physical activity, but bed rest is not required  No reading, computer work, or close hand work  No bending, lifting, or straining.  May watch TV  For 24 hours:  No driving, legal decisions, or alcoholic beverages  Safety precautions  Eat anything you prefer: It is better to start with liquids, then soup then solid foods.  _____ Eye patch should be worn until postoperative exam tomorrow.  ____ Solar shield eyeglasses should be worn for comfort in the sunlight/patch while sleeping  Resume all regular medications including aspirin or Coumadin if these were discontinued prior to surgery. You may shower, bathe, shave, or wash your hair. Tylenol may be taken for mild discomfort.  Call your doctor if you experience significant pain, nausea, or vomiting, fever > 101 or other signs of infection. [phone] or [phone] Specific instructions:  Follow-up Information    Benay Pillow, MD Follow up.   Specialty:  Ophthalmology Why:  November 10 at 9:15am Contact information: [address] [place] [address] [phone]

## 2016-07-21 NOTE — Anesthesia Postprocedure Evaluation (Signed)
Anesthesia Post Note  Patient: Juan Daniels  Procedure(s) Performed: Procedure(s) (LRB): CATARACT EXTRACTION PHACO AND INTRAOCULAR LENS PLACEMENT (IOC) (Left)  Patient location during evaluation: PACU Anesthesia Type: MAC Level of consciousness: awake, awake and alert and oriented Pain management: pain level controlled Vital Signs Assessment: post-procedure vital signs reviewed and stable Respiratory status: spontaneous breathing, nonlabored ventilation and respiratory function stable Cardiovascular status: blood pressure returned to baseline and stable Postop Assessment: no headache, no backache, patient able to bend at knees, no signs of nausea or vomiting and adequate PO intake Anesthetic complications: no    Last Vitals:  Vitals:   07/21/16 0825 07/21/16 0950  BP: 139/81 127/77  Pulse: 63 61  Resp: 17 12  Temp: 36.8 C 36.8 C    Last Pain:  Vitals:   07/21/16 0950  TempSrc: Oral                 Hedda Slade

## 2016-07-21 NOTE — H&P (Signed)
The History and Physical notes are on paper, have been signed, and are to be scanned. The patient remains stable and unchanged from the H&P.   Previous H&P reviewed, patient examined, and there are no changes.  Juan Daniels 07/21/2016 9:11 AM

## 2016-07-22 ENCOUNTER — Encounter: Payer: Self-pay | Admitting: Ophthalmology

## 2016-08-03 ENCOUNTER — Encounter: Payer: Self-pay | Admitting: *Deleted

## 2016-08-11 ENCOUNTER — Ambulatory Visit: Payer: Medicare Other | Admitting: Anesthesiology

## 2016-08-11 ENCOUNTER — Encounter: Payer: Self-pay | Admitting: *Deleted

## 2016-08-11 ENCOUNTER — Encounter
Admission: RE | Disposition: A | Discharge status: Home / Self Care | Payer: Self-pay | Source: Ambulatory Visit | Attending: Ophthalmology

## 2016-08-11 ENCOUNTER — Ambulatory Visit
Admission: RE | Admit: 2016-08-11 | Discharge: 2016-08-11 | Disposition: A | Discharge status: Home / Self Care | Payer: Medicare Other | Source: Ambulatory Visit | Attending: Ophthalmology | Admitting: Ophthalmology

## 2016-08-11 DIAGNOSIS — Z955 Presence of coronary angioplasty implant and graft: Secondary | ICD-10-CM | POA: Insufficient documentation

## 2016-08-11 DIAGNOSIS — F329 Major depressive disorder, single episode, unspecified: Secondary | ICD-10-CM | POA: Diagnosis not present

## 2016-08-11 DIAGNOSIS — Z85828 Personal history of other malignant neoplasm of skin: Secondary | ICD-10-CM | POA: Diagnosis not present

## 2016-08-11 DIAGNOSIS — I10 Essential (primary) hypertension: Secondary | ICD-10-CM | POA: Diagnosis not present

## 2016-08-11 DIAGNOSIS — G629 Polyneuropathy, unspecified: Secondary | ICD-10-CM | POA: Diagnosis not present

## 2016-08-11 DIAGNOSIS — Z87891 Personal history of nicotine dependence: Secondary | ICD-10-CM | POA: Insufficient documentation

## 2016-08-11 DIAGNOSIS — G4730 Sleep apnea, unspecified: Secondary | ICD-10-CM | POA: Insufficient documentation

## 2016-08-11 DIAGNOSIS — I4891 Unspecified atrial fibrillation: Secondary | ICD-10-CM | POA: Insufficient documentation

## 2016-08-11 DIAGNOSIS — H2511 Age-related nuclear cataract, right eye: Secondary | ICD-10-CM | POA: Insufficient documentation

## 2016-08-11 HISTORY — PX: CATARACT EXTRACTION W/PHACO: SHX586

## 2016-08-11 SURGERY — PHACOEMULSIFICATION, CATARACT, WITH IOL INSERTION
Anesthesia: Monitor Anesthesia Care | Site: Eye | Laterality: Right | Wound class: Clean

## 2016-08-11 MED ORDER — MIDAZOLAM HCL 2 MG/2ML IJ SOLN
INTRAMUSCULAR | Status: DC | PRN
  Administered 2016-08-11: 1 mg via INTRAVENOUS

## 2016-08-11 MED ORDER — SODIUM CHLORIDE 0.9 % IV SOLN
INTRAVENOUS | Status: DC
  Administered 2016-08-11: 08:00:00 via INTRAVENOUS

## 2016-08-11 MED ORDER — POVIDONE-IODINE 5 % OP SOLN
OPHTHALMIC | Status: AC
  Filled 2016-08-11: qty 30

## 2016-08-11 MED ORDER — FENTANYL CITRATE (PF) 100 MCG/2ML IJ SOLN
INTRAMUSCULAR | Status: DC | PRN
  Administered 2016-08-11: 50 ug via INTRAVENOUS

## 2016-08-11 MED ORDER — SODIUM HYALURONATE 23 MG/ML IO SOLN
INTRAOCULAR | Status: AC
  Filled 2016-08-11: qty 0.6

## 2016-08-11 MED ORDER — SODIUM HYALURONATE 10 MG/ML IO SOLN
INTRAOCULAR | Status: AC
  Filled 2016-08-11: qty 0.85

## 2016-08-11 MED ORDER — ARMC OPHTHALMIC DILATING DROPS
1.0000 "application " | OPHTHALMIC | Status: AC
  Administered 2016-08-11 (×3): 1 via OPHTHALMIC

## 2016-08-11 MED ORDER — EPINEPHRINE PF 1 MG/ML IJ SOLN
INTRAMUSCULAR | Status: AC
  Filled 2016-08-11: qty 2

## 2016-08-11 MED ORDER — MOXIFLOXACIN HCL 0.5 % OP SOLN: 1.0000 [drp] | OPHTHALMIC | Status: DC | PRN

## 2016-08-11 MED ORDER — LIDOCAINE HCL (PF) 4 % IJ SOLN
INTRAMUSCULAR | Status: AC
  Filled 2016-08-11: qty 5

## 2016-08-11 MED ORDER — ARMC OPHTHALMIC DILATING DROPS
OPHTHALMIC | Status: AC
  Filled 2016-08-11: qty 0.4

## 2016-08-11 MED ORDER — MOXIFLOXACIN HCL 0.5 % OP SOLN
OPHTHALMIC | Status: AC
  Filled 2016-08-11: qty 3

## 2016-08-11 SURGICAL SUPPLY — 23 items
CANNULA ANT/CHMB 27GA (MISCELLANEOUS) ×6 IMPLANT
CUP MEDICINE 2OZ PLAST GRAD ST (MISCELLANEOUS) ×3 IMPLANT
DISSECTOR HYDRO NUCLEUS 50X22 (MISCELLANEOUS) ×3 IMPLANT
GLOVE BIO SURGEON STRL SZ8 (GLOVE) ×3 IMPLANT
GLOVE BIOGEL M 6.5 STRL (GLOVE) ×3 IMPLANT
GLOVE SURG LX 7.5 STRW (GLOVE) ×2
GLOVE SURG LX STRL 7.5 STRW (GLOVE) ×1 IMPLANT
GOWN STRL REUS W/ TWL LRG LVL3 (GOWN DISPOSABLE) ×2 IMPLANT
GOWN STRL REUS W/TWL LRG LVL3 (GOWN DISPOSABLE) ×4
LENS IOL TECNIS ITEC 20.0 (Intraocular Lens) ×3 IMPLANT
NEEDLE CAPSULORHEX 25GA (NEEDLE) ×3 IMPLANT
PACK CATARACT (MISCELLANEOUS) ×3 IMPLANT
PACK CATARACT BRASINGTON LX (MISCELLANEOUS) ×3 IMPLANT
PACK EYE AFTER SURG (MISCELLANEOUS) ×3 IMPLANT
SOL BSS BAG (MISCELLANEOUS) ×3
SOL PREP PVP 2OZ (MISCELLANEOUS) ×3
SOLUTION BSS BAG (MISCELLANEOUS) ×1 IMPLANT
SOLUTION PREP PVP 2OZ (MISCELLANEOUS) ×1 IMPLANT
SYR 3ML LL SCALE MARK (SYRINGE) ×6 IMPLANT
SYR 5ML LL (SYRINGE) ×3 IMPLANT
SYR TB 1ML 27GX1/2 LL (SYRINGE) ×3 IMPLANT
WATER STERILE IRR 250ML POUR (IV SOLUTION) ×3 IMPLANT
WIPE NON LINTING 3.25X3.25 (MISCELLANEOUS) ×3 IMPLANT

## 2016-08-11 NOTE — OR Nursing (Signed)
IV dc'd w/o incident site clear soft.

## 2016-08-11 NOTE — OR Nursing (Signed)
IV site c,D, soft infusing NS well

## 2016-08-11 NOTE — H&P (Signed)
The History and Physical notes are on paper, have been signed, and are to be scanned. The patient remains stable and unchanged from the H&P.   Previous H&P reviewed, patient examined, and there are no changes.  Benay Pillow 08/11/2016 8:54 AM

## 2016-08-11 NOTE — Discharge Instructions (Signed)

## 2016-08-11 NOTE — Anesthesia Postprocedure Evaluation (Signed)
Anesthesia Post Note  Patient: Juan Daniels  Procedure(s) Performed: Procedure(s) (LRB): CATARACT EXTRACTION PHACO AND INTRAOCULAR LENS PLACEMENT (IOC) (Right)  Patient location during evaluation: PACU Anesthesia Type: MAC Level of consciousness: awake and alert Pain management: pain level controlled Vital Signs Assessment: post-procedure vital signs reviewed and stable Respiratory status: spontaneous breathing, nonlabored ventilation and respiratory function stable Cardiovascular status: blood pressure returned to baseline and stable Postop Assessment: no signs of nausea or vomiting Anesthetic complications: no    Last Vitals:  Vitals:   08/11/16 0729 08/11/16 0931  BP: (!) 147/85 127/78  Pulse: 61 (!) 59  Resp: 16 16  Temp: 36.7 C 36.3 C    Last Pain:  Vitals:   08/11/16 0931  TempSrc: Temporal                 Brantley Naser

## 2016-08-11 NOTE — Op Note (Signed)
OPERATIVE NOTE  Juan Daniels [id number] 08/11/2016   PREOPERATIVE DIAGNOSIS:  Nuclear sclerotic cataract right eye.  H25.11   POSTOPERATIVE DIAGNOSIS:    Nuclear sclerotic cataract right eye.     PROCEDURE:  Phacoemusification with posterior chamber intraocular lens placement of the right eye   LENS:   Implant Name Type Inv. Item Serial No. Manufacturer Lot No. LRB No. Used  LENS IOL DIOP 20.0 - [id number] Intraocular Lens LENS IOL DIOP 20.0 [id number] AMO   Right 1       PCB00 +20.0   ULTRASOUND TIME: 0 minutes 46 seconds.  CDE 5.61   SURGEON:  Benay Pillow, MD, MPH  ANESTHESIOLOGIST: Anesthesiologist: Emmie Niemann, MD CRNA: Darlyne Russian, CRNA   ANESTHESIA:  Topical with tetracaine drops augmented with 1% preservative-free intracameral lidocaine.  ESTIMATED BLOOD LOSS: less than 1 mL.   COMPLICATIONS:  None.   DESCRIPTION OF PROCEDURE:  The patient was identified in the holding room and transported to the operating room and placed in the supine position under the operating microscope.  The right eye was identified as the operative eye and it was prepped and draped in the usual sterile ophthalmic fashion.   A 1.0 millimeter clear-corneal paracentesis was made at the 10:30 position. 0.5 ml of preservative-free 1% lidocaine with epinephrine was injected into the anterior chamber.  The anterior chamber was filled with Healon 5 viscoelastic.  A 2.4 millimeter keratome was used to make a near-clear corneal incision at the 8:00 position.  A curvilinear capsulorrhexis was made with a cystotome and capsulorrhexis forceps.  Balanced salt solution was used to hydrodissect and hydrodelineate the nucleus.   Phacoemulsification was then used in stop and chop fashion to remove the lens nucleus and epinucleus.  The remaining cortex was then removed using the irrigation and aspiration handpiece. Healon was then placed into the capsular bag to distend it for lens placement.  A lens was then  injected into the capsular bag.  The remaining viscoelastic was aspirated.   Wounds were hydrated with balanced salt solution.  The anterior chamber was inflated to a physiologic pressure with balanced salt solution.   Intracameral vigamox 0.1 mL undiluted was injected into the eye and a drop placed onto the ocular surface.  No wound leaks were noted.  The patient was taken to the recovery room in stable condition without complications of anesthesia or surgery  Benay Pillow 08/11/2016, 9:29 AM

## 2016-08-11 NOTE — Anesthesia Preprocedure Evaluation (Signed)
Anesthesia Evaluation  Patient identified by MRN, date of birth, ID band Patient awake    Reviewed: Allergy & Precautions, NPO status , Patient's Chart, lab work & pertinent test results  History of Anesthesia Complications Negative for: history of anesthetic complications  Airway Mallampati: II  TM Distance: >3 FB Neck ROM: Full    Dental  (+) Edentulous Upper, Edentulous Lower   Pulmonary sleep apnea (does not tolerate CPAP) , neg COPD, former smoker,    breath sounds clear to auscultation- rhonchi (-) wheezing      Cardiovascular hypertension, (-) CAD, (-) Past MI and (-) Cardiac Stents + dysrhythmias + pacemaker + Valvular Problems/Murmurs (hx of AVR)  Rate:Normal - Systolic murmurs and - Diastolic murmurs    Neuro/Psych Depression negative neurological ROS     GI/Hepatic negative GI ROS, Neg liver ROS,   Endo/Other  negative endocrine ROSneg diabetes  Renal/GU negative Renal ROS     Musculoskeletal negative musculoskeletal ROS (+)   Abdominal (+) + obese,   Peds  Hematology negative hematology ROS (+)   Anesthesia Other Findings Past Medical History: No date: Brain condition     Comment: brain stem disorder No date: Cancer ([place])     Comment: skin No date: Complication of anesthesia     Comment: hard to wake uop after kidney stone balloon               surgery No date: Depression No date: Dyspnea     Comment: doe No date: Dysrhythmia     Comment: afib No date: Edema     Comment: feet/legs No date: Heart murmur No date: History of kidney stones No date: Hypertension No date: Memory change     Comment: loss,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 No date: Neuropathy ([place])     Comment: feet 2007: Presence of permanent cardiac pacemaker No date: Sleep apnea     Comment: does not wear cpap No date: Tremors of nervous system     Comment:  hands No date: Wheezing     Comment: at night when sleeps   Reproductive/Obstetrics                             Anesthesia Physical Anesthesia Plan  ASA: III  Anesthesia Plan: MAC   Post-op Pain Management:    Induction: Intravenous  Airway Management Planned: Natural Airway  Additional Equipment:   Intra-op Plan:   Post-operative Plan:   Informed Consent: I have reviewed the patients History and Physical, chart, labs and discussed the procedure including the risks, benefits and alternatives for the proposed anesthesia with the patient or authorized representative who has indicated his/her understanding and acceptance.   Dental advisory given  Plan Discussed with: CRNA and Anesthesiologist  Anesthesia Plan Comments:         Anesthesia Quick Evaluation

## 2016-08-11 NOTE — Transfer of Care (Signed)
Immediate Anesthesia Transfer of Care Note  Patient: Juan Daniels  Procedure(s) Performed: Procedure(s) with comments: CATARACT EXTRACTION PHACO AND INTRAOCULAR LENS PLACEMENT (IOC) (Right) - [place] 46.7 AP% 21.1 CDE 5.61 Fluid pack lot # [id number]  Patient Location: PACU  Anesthesia Type:MAC  Level of Consciousness: awake, alert , oriented and patient cooperative  Airway & Oxygen Therapy: Patient Spontanous Breathing  Post-op Assessment: Report given to RN and Post -op Vital signs reviewed and stable  Post vital signs: Reviewed and stable  Last Vitals:  Vitals:   08/11/16 0729 08/11/16 0931  BP: (!) 147/85 127/78  Pulse: 61 60  Resp: 16 16  Temp: 36.7 C     Last Pain:  Vitals:   08/11/16 0931  TempSrc: Temporal         Complications: No apparent anesthesia complications

## 2017-12-18 ENCOUNTER — Ambulatory Visit
Admission: RE | Admit: 2017-12-18 | Discharge: 2017-12-18 | Disposition: A | Discharge status: Home / Self Care | Payer: Medicare Other | Source: Ambulatory Visit | Attending: Specialist | Admitting: Specialist

## 2017-12-18 ENCOUNTER — Other Ambulatory Visit: Payer: Self-pay | Admitting: Specialist

## 2017-12-18 DIAGNOSIS — R062 Wheezing: Secondary | ICD-10-CM | POA: Insufficient documentation

## 2017-12-18 DIAGNOSIS — R6883 Chills (without fever): Secondary | ICD-10-CM

## 2017-12-18 DIAGNOSIS — R509 Fever, unspecified: Secondary | ICD-10-CM

## 2017-12-18 DIAGNOSIS — R05 Cough: Secondary | ICD-10-CM

## 2017-12-18 DIAGNOSIS — R059 Cough, unspecified: Secondary | ICD-10-CM

## 2019-11-11 IMAGING — CR DG CHEST 2V
2 series · 2 of 2 positions shown · non-contrast
Comparison: 11/19/2012

CLINICAL DATA: Productive cough, fever, and wheezing for 1.5 weeks.
Coronary artery disease.

EXAM:
CHEST - 2 VIEW

[chest pa]
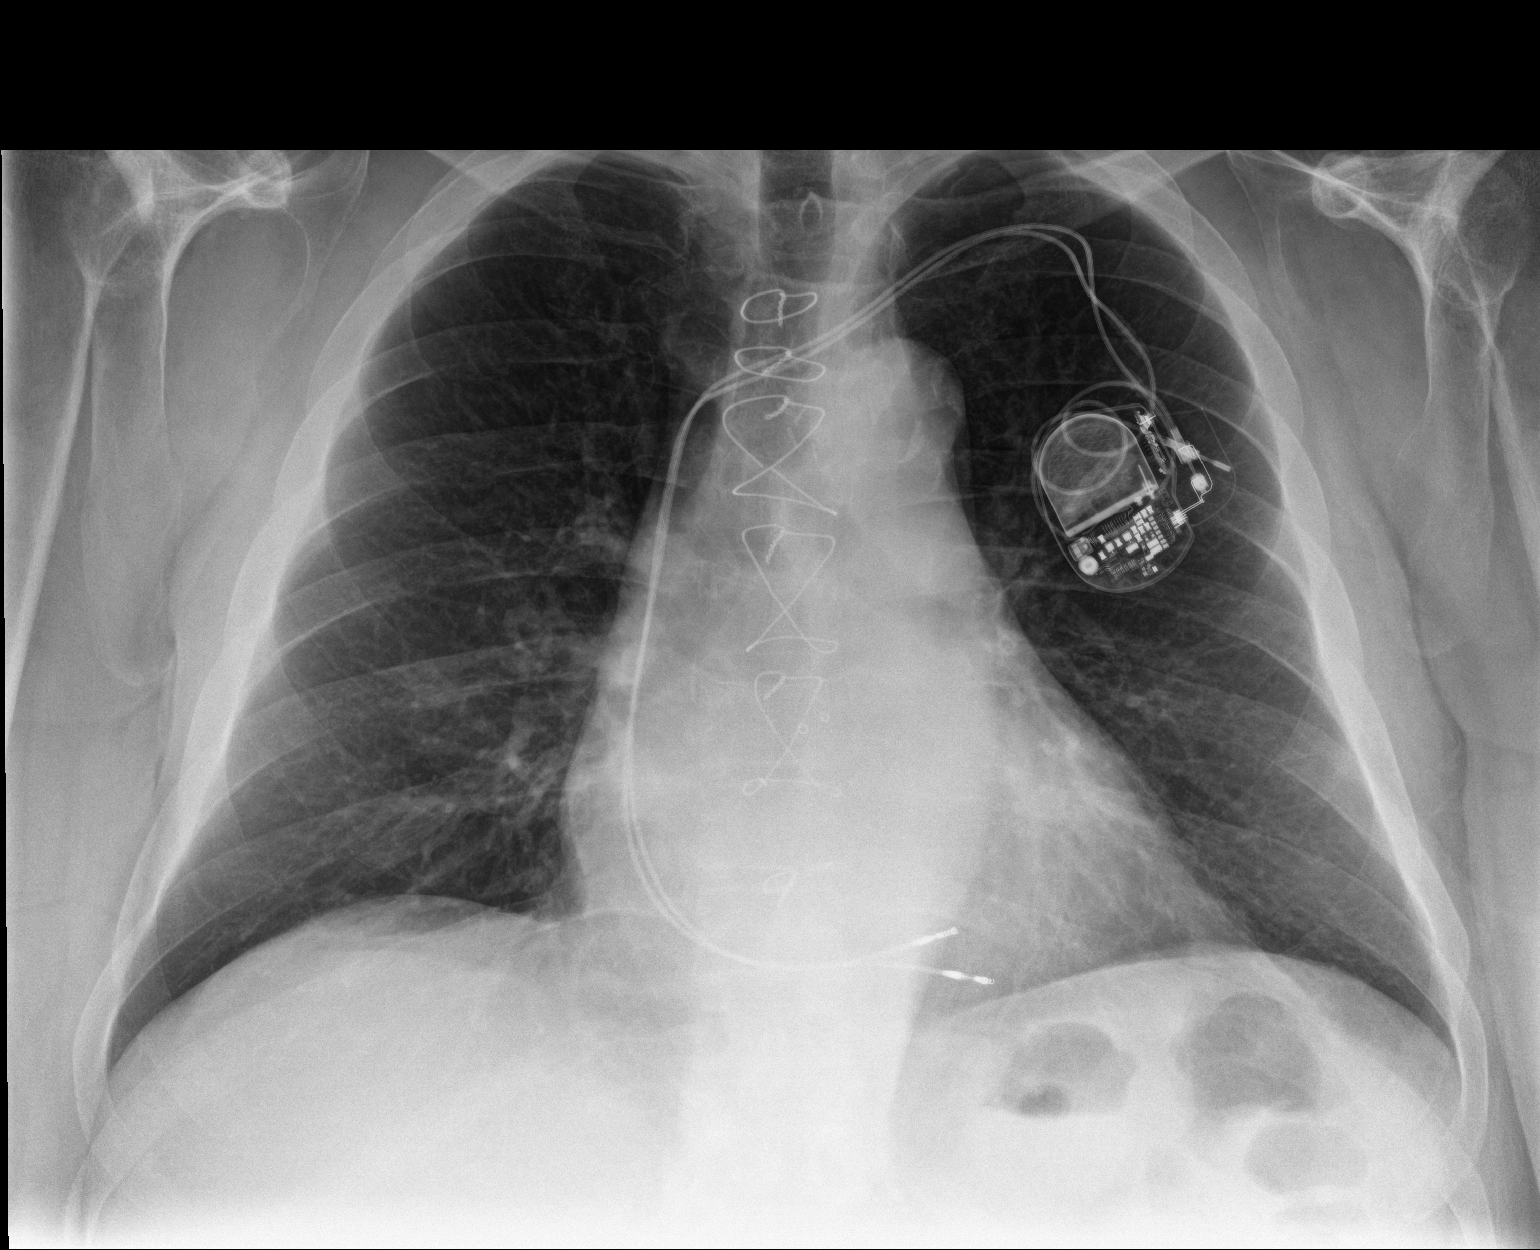

[chest lat]
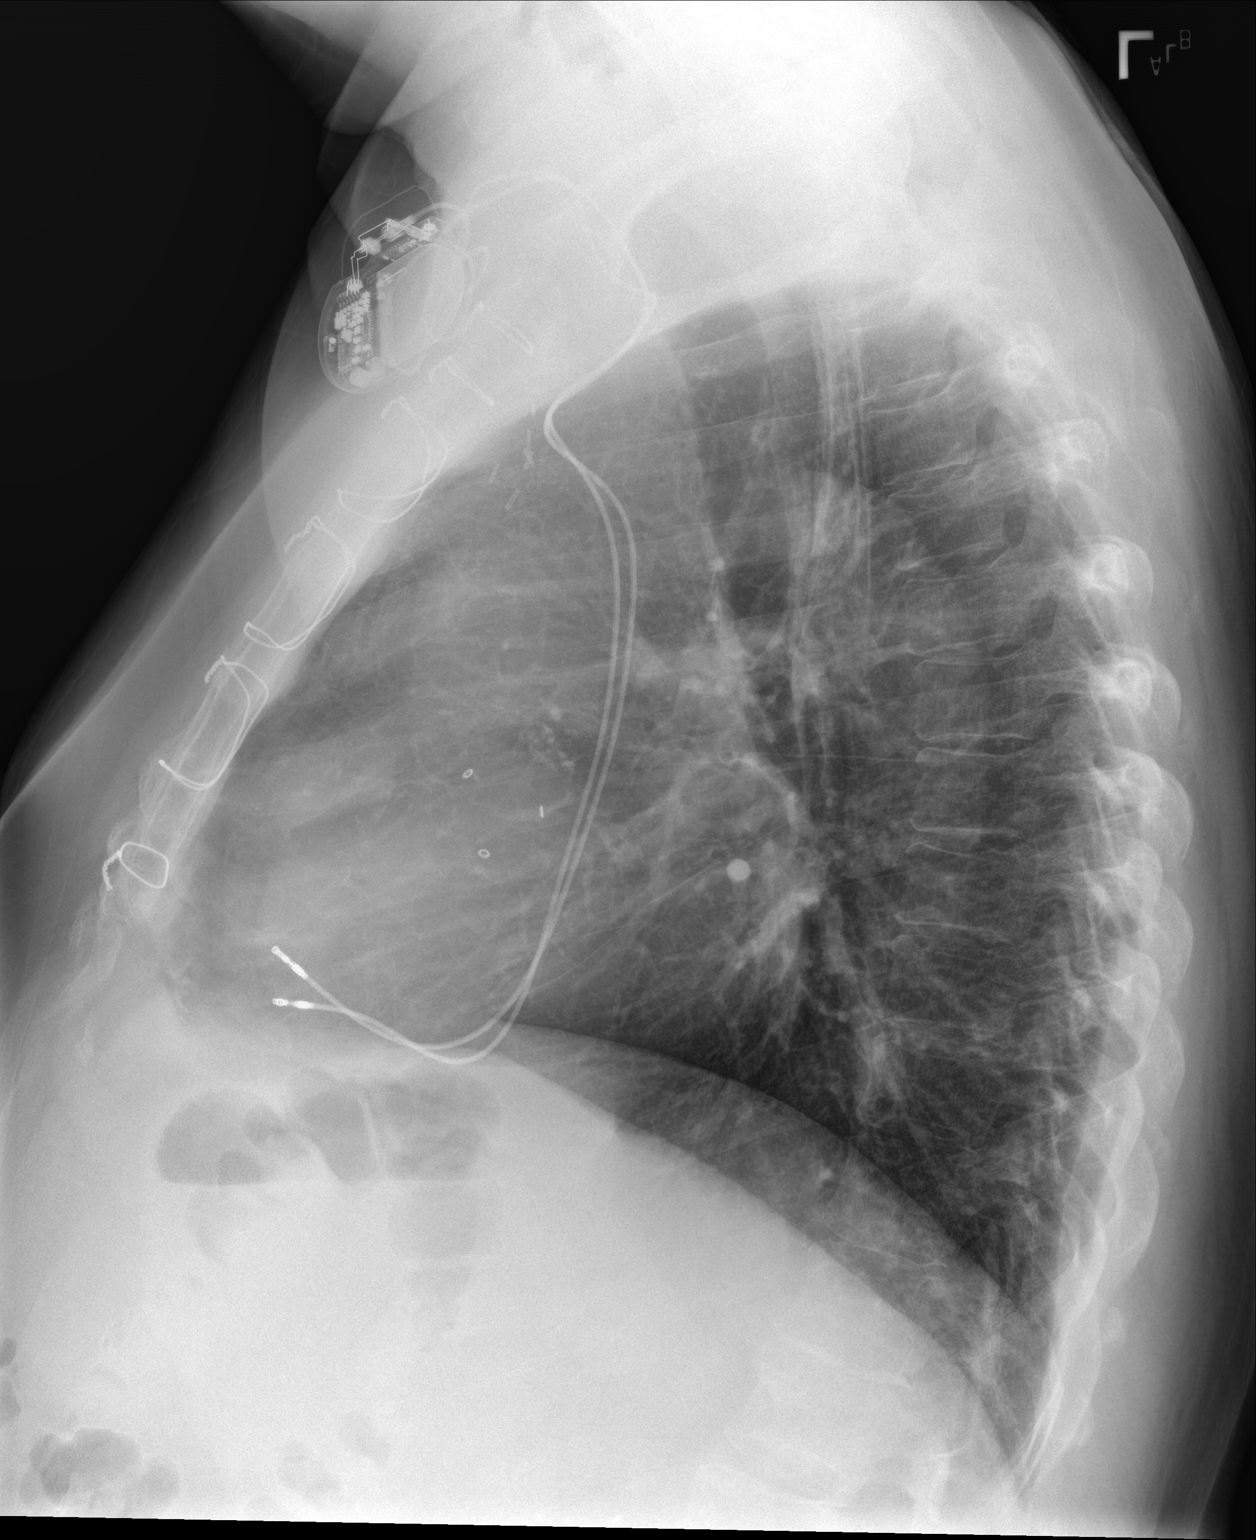

[2 of 2 positions shown; findings below may reference images not displayed]

FINDINGS: The heart size and mediastinal contours are within normal limits.
Pacemaker seen in appropriate position. Prior CABG noted. Aortic
atherosclerosis. Both lungs are clear. The visualized skeletal
structures are unremarkable.
IMPRESSION: No active cardiopulmonary disease.

## 2021-11-10 DEATH — deceased
